# Patient Record
Sex: Male | Born: 1990 | Race: White | Hispanic: No | Marital: Single | Smoking: Current every day smoker
Health system: Southern US, Community
[De-identification: ages and names within clinical notes are randomized; demographics above are authoritative.]

---

## 2016-08-29 ENCOUNTER — Other Ambulatory Visit: Payer: Self-pay | Admitting: Nurse Practitioner

## 2016-08-29 DIAGNOSIS — N5089 Other specified disorders of the male genital organs: Secondary | ICD-10-CM

## 2016-09-01 ENCOUNTER — Other Ambulatory Visit: Payer: Self-pay

## 2016-09-20 ENCOUNTER — Ambulatory Visit
Admission: RE | Admit: 2016-09-20 | Discharge: 2016-09-20 | Disposition: A | Discharge status: Home / Self Care | Payer: BLUE CROSS/BLUE SHIELD | Source: Ambulatory Visit | Attending: Nurse Practitioner | Admitting: Nurse Practitioner

## 2016-09-20 DIAGNOSIS — N5089 Other specified disorders of the male genital organs: Secondary | ICD-10-CM

## 2017-09-17 IMAGING — US US SCROTUM
1 series · 13 of 25 positions shown · non-contrast
Comparison: None.

CLINICAL DATA: Initial evaluation for right testicular lump for 6
months.

EXAM:
SCROTAL ULTRASOUND
DOPPLER ULTRASOUND OF THE TESTICLES
TECHNIQUE: Complete ultrasound examination of the testicles, epididymis, and
other scrotal structures was performed. Color and spectral Doppler
ultrasound were also utilized to evaluate blood flow to the
testicles.

[Series 1: us scrotum · 13 of 53 slices shown]
[im 1/53]
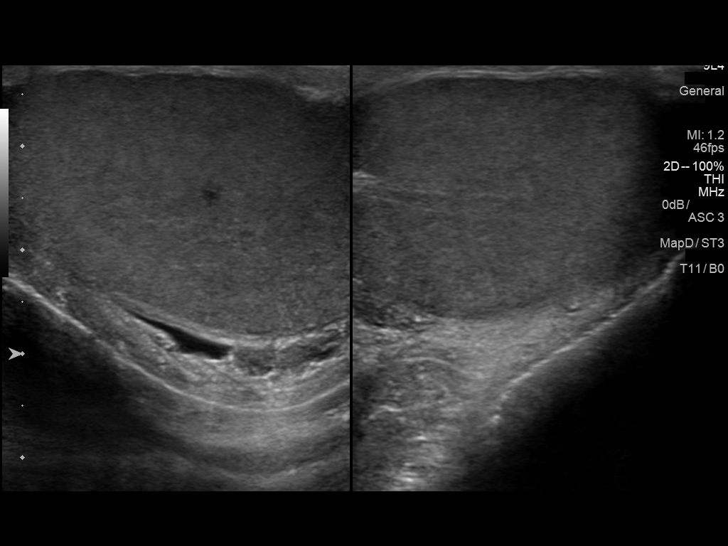
[im 5/53]
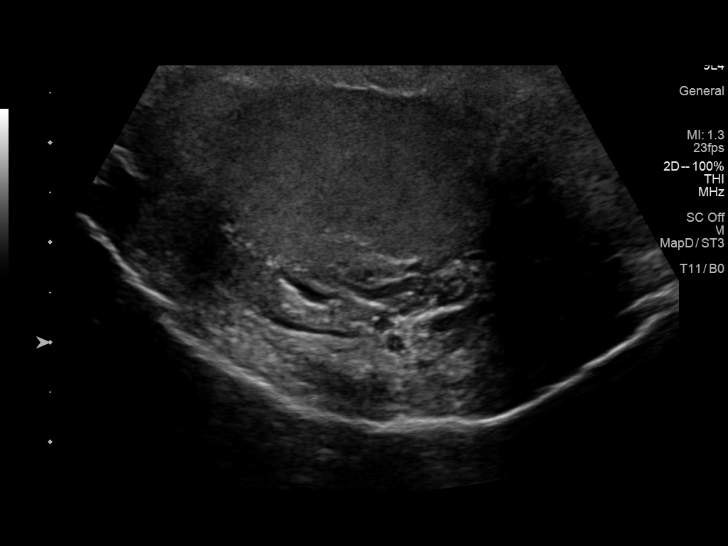
[im 9/53]
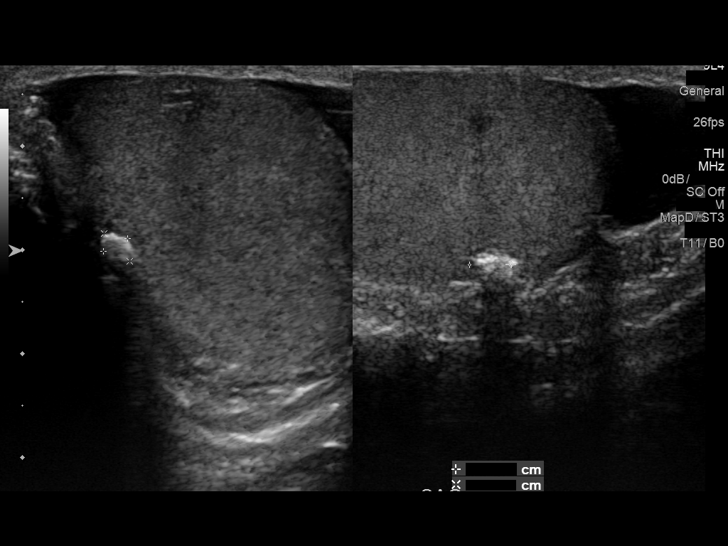
[im 14/53]
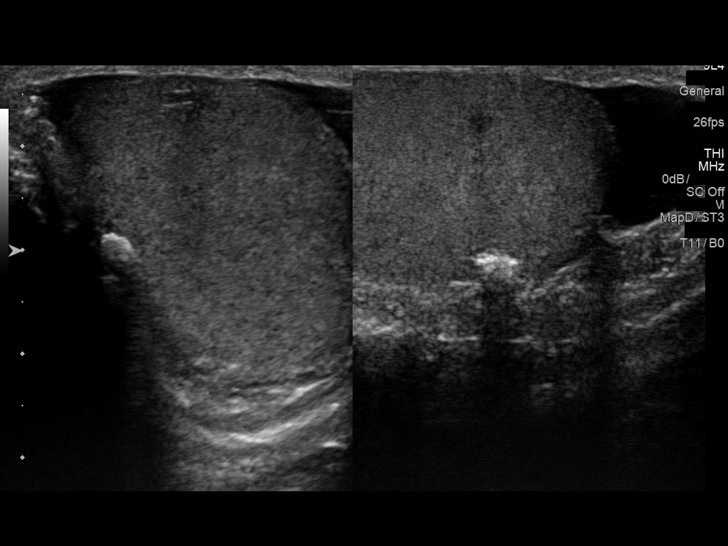
[im 18/53]
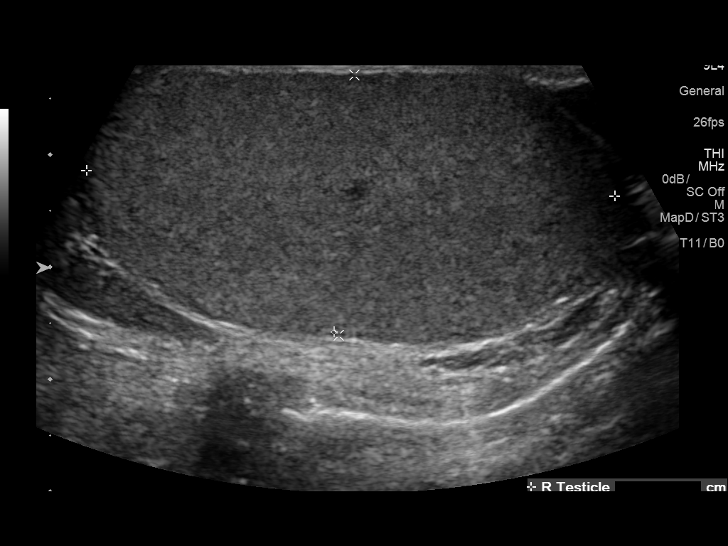
[im 22/53]
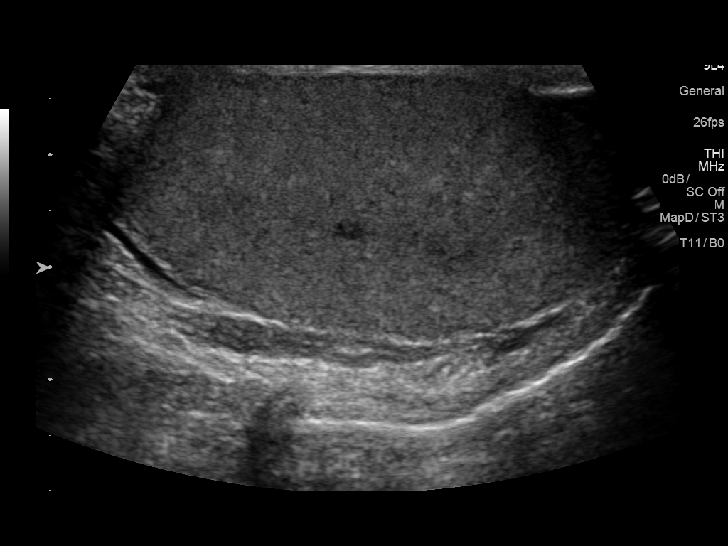
[im 27/53]
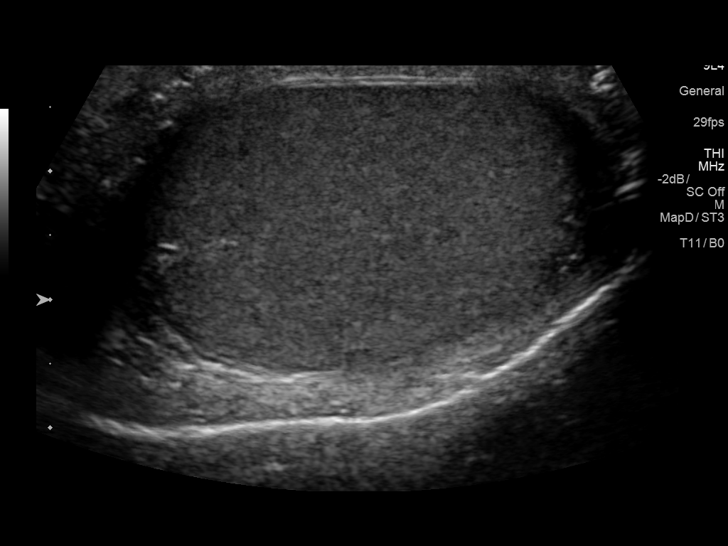
[im 31/53]
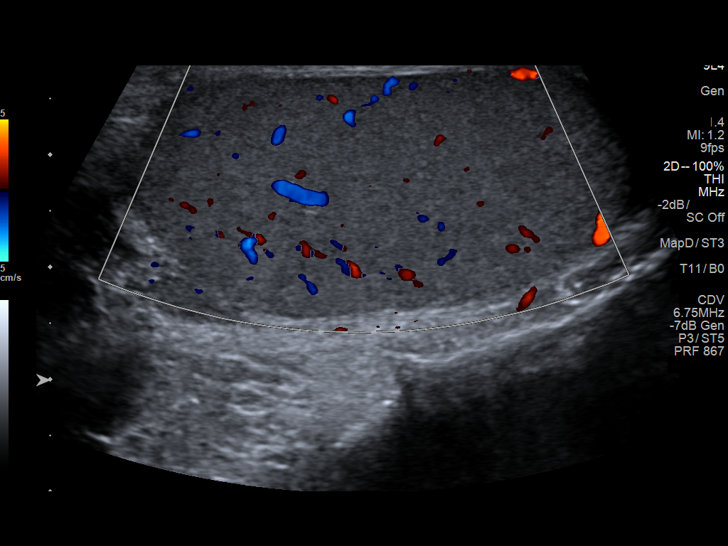
[im 35/53]
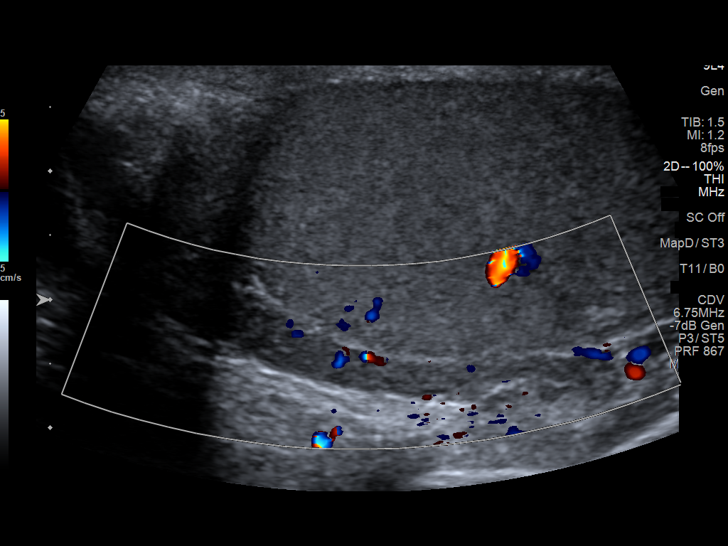
[im 40/53]
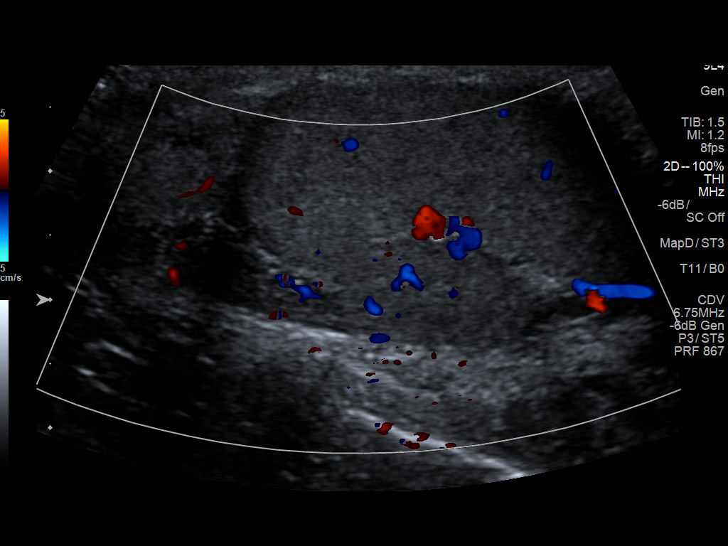
[im 44/53]
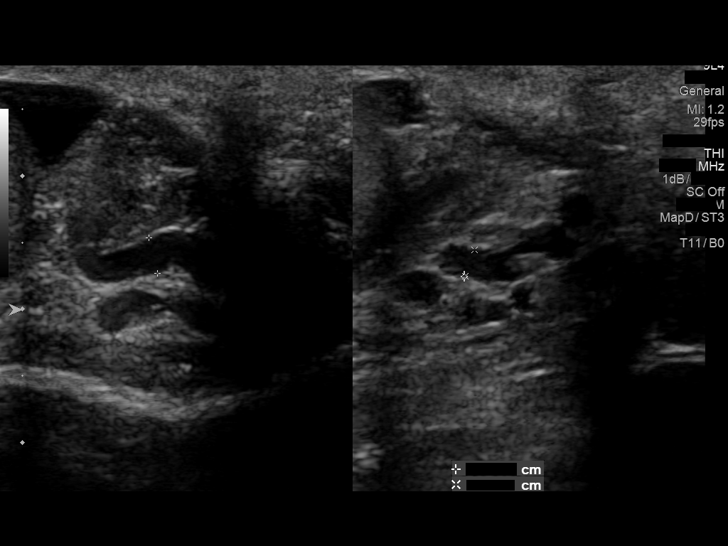
[im 48/53]
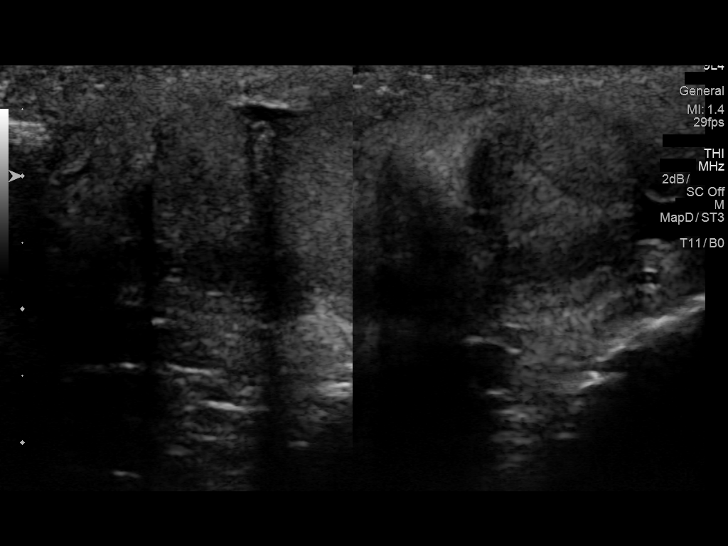
[im 53/53]
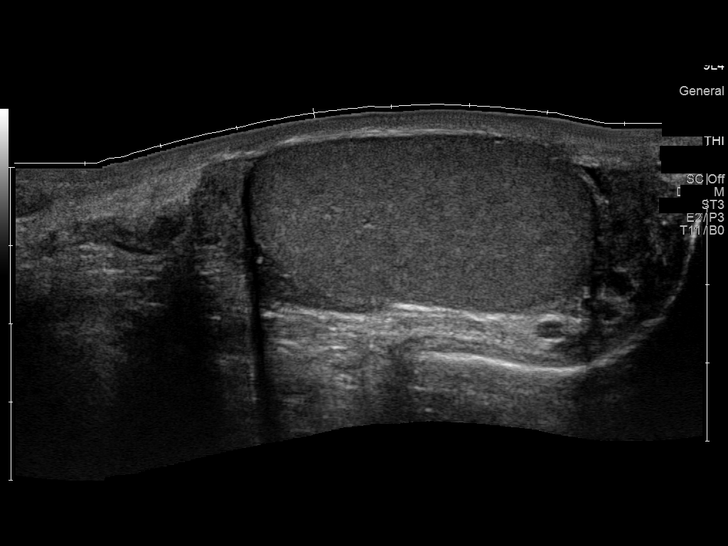

[13 of 25 positions shown; findings below may reference images not displayed]

FINDINGS: Right testicle

Measurements: 4.7 x 2.3 x 3.0 cm. No mass or microlithiasis
visualized. 3 x 4 x 4 mm echogenic focus at the inferior right
testis felt to be most consistent with a calcification. This is of
doubtful significance.

Left testicle

Measurements: 4.4 x 2.1 x 3.2 cm. No mass or microlithiasis
visualized.

Right epididymis:  Normal in size and appearance.

Left epididymis:  Normal in size and appearance.

Hydrocele:  None visualized.

Varicocele:  None visualized.

Pulsed Doppler interrogation of both testes demonstrates normal low
resistance arterial and venous waveforms bilaterally.

Per ultrasound tech, palpable area concern appears to reflect the
right spermatic cord.
IMPRESSION: 1. No cystic or solid mass identified about the testicles or
scrotum. Per the ultrasound tech, the palpable area concern appears
to represent the normal right spermatic cord.
2. 3 x 4 x 4 mm calcification at the inferior right testicle.
Finding is of uncertain etiology, but may reflect sequelae of prior
trauma or inflammation. This is felt to be of doubtful significance.
3. No other acute abnormality identified.

## 2020-01-30 ENCOUNTER — Other Ambulatory Visit: Payer: BLUE CROSS/BLUE SHIELD

## 2021-11-09 ENCOUNTER — Encounter (HOSPITAL_COMMUNITY): Payer: Self-pay | Admitting: Emergency Medicine

## 2021-11-09 ENCOUNTER — Emergency Department (HOSPITAL_COMMUNITY)
Admission: EM | Admit: 2021-11-09 | Discharge: 2021-11-09 | Disposition: A | Discharge status: Home / Self Care | Payer: BC Managed Care – PPO | Attending: Emergency Medicine | Admitting: Emergency Medicine

## 2021-11-09 ENCOUNTER — Other Ambulatory Visit: Payer: Self-pay

## 2021-11-09 DIAGNOSIS — S0093XA Contusion of unspecified part of head, initial encounter: Secondary | ICD-10-CM | POA: Insufficient documentation

## 2021-11-09 DIAGNOSIS — Y92410 Unspecified street and highway as the place of occurrence of the external cause: Secondary | ICD-10-CM | POA: Insufficient documentation

## 2021-11-09 DIAGNOSIS — R519 Headache, unspecified: Secondary | ICD-10-CM | POA: Diagnosis present

## 2021-11-09 NOTE — Discharge Instructions (Signed)
Tylenol for headache
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
## 2021-11-09 NOTE — ED Notes (Signed)
Patient states that he had a headache earlier today and had a period of dizziness.

## 2022-01-24 ENCOUNTER — Ambulatory Visit (HOSPITAL_COMMUNITY)
Admission: EM | Admit: 2022-01-24 | Discharge: 2022-01-24 | Disposition: A | Discharge status: Home / Self Care | Payer: BC Managed Care – PPO

## 2022-01-24 ENCOUNTER — Encounter (HOSPITAL_COMMUNITY): Payer: Self-pay | Admitting: Emergency Medicine

## 2022-01-24 DIAGNOSIS — K529 Noninfective gastroenteritis and colitis, unspecified: Secondary | ICD-10-CM | POA: Diagnosis not present

## 2022-01-24 DIAGNOSIS — R197 Diarrhea, unspecified: Secondary | ICD-10-CM

## 2022-01-24 DIAGNOSIS — R110 Nausea: Secondary | ICD-10-CM

## 2022-01-24 MED ORDER — DICYCLOMINE HCL 20 MG PO TABS: 20.0000 mg | ORAL_TABLET | Freq: Two times a day (BID) | ORAL | 0 refills | Status: AC

## 2022-01-24 MED ORDER — ONDANSETRON HCL 4 MG PO TABS: 4.0000 mg | ORAL_TABLET | Freq: Four times a day (QID) | ORAL | 0 refills | Status: AC

## 2022-01-24 NOTE — ED Provider Notes (Signed)
[facility]    CSN: [id number] Arrival date & time: 01/24/22  1030      History   Chief Complaint Chief Complaint  Patient presents with   Diarrhea    May have gotten food poisoning Sat afternoon. I threw up around 4pm Sat afternoon and have had diarrhea since then. I've also had stomach aches and intestinal pain. - Entered by patient    HPI Karl Campbell is a 32 y.o. male.   Patient presents today with abdominal cramping slight nausea and diarrhea since Saturday.  Patient states that he ate some pizza and approximately 3 hours later started having diarrhea and stomach cramping.  Patient has tried to take Pepto-Bismol with minimal relief.  He is able to keep some Gatorade down but just was concerned that he is not able to stay as hydrated.  Had emesis only once since Saturday none today.  Denies any chest pain no shortness of breath no fever.    History reviewed. No pertinent past medical history.  There are no problems to display for this patient.   History reviewed. No pertinent surgical history.     Home Medications    Prior to Admission medications   Medication Sig Start Date End Date Taking? Authorizing Provider  buPROPion (WELLBUTRIN SR) 150 MG 12 hr tablet Take 150 mg by mouth 2 (two) times daily. 01/14/22  Yes [provider]  dicyclomine (BENTYL) 20 MG tablet Take 1 tablet (20 mg total) by mouth 2 (two) times daily. 01/24/22  Yes Marney Setting, NP  ondansetron (ZOFRAN) 4 MG tablet Take 1 tablet (4 mg total) by mouth every 6 (six) hours. 01/24/22  Yes Marney Setting, NP    Family History History reviewed. No pertinent family history.  Social History Social History   Tobacco Use   Smoking status: Every Day    Types: Cigarettes   Smokeless tobacco: Never  Substance Use Topics   Alcohol use: Yes   Drug use: Yes    Types: Cocaine     Allergies   Shellfish allergy and Amoxicillin   Review of Systems Review of Systems   Constitutional: Negative.  Negative for chills and fever.  Eyes: Negative.   Respiratory: Negative.    Cardiovascular: Negative.   Gastrointestinal:  Positive for abdominal pain, diarrhea, nausea and vomiting. Negative for abdominal distention, anal bleeding and blood in stool.  Genitourinary: Negative.   Skin: Negative.   Neurological: Negative.      Physical Exam Triage Vital Signs ED Triage Vitals  Enc Vitals Group     BP 01/24/22 1150 (!) 158/92     Pulse Rate 01/24/22 1150 83     Resp 01/24/22 1150 16     Temp 01/24/22 1150 98.3 F (36.8 C)     Temp Source 01/24/22 1150 Oral     SpO2 01/24/22 1150 98 %     Weight --      Height --      Head Circumference --      Peak Flow --      Pain Score 01/24/22 1151 5     Pain Loc --      Pain Edu? --      Excl. in [place]? --    No data found.  Updated Vital Signs BP (!) 158/92 (BP Location: Left Arm)   Pulse 83   Temp 98.3 F (36.8 C) (Oral)   Resp 16   SpO2 98%   Visual Acuity Right Eye Distance:  Left Eye Distance:   Bilateral Distance:    Right Eye Near:   Left Eye Near:    Bilateral Near:     Physical Exam Constitutional:      Appearance: Normal appearance.  Cardiovascular:     Rate and Rhythm: Normal rate.     Pulses: Normal pulses.     Heart sounds: Normal heart sounds.  Pulmonary:     Effort: Pulmonary effort is normal.     Breath sounds: Normal breath sounds.  Abdominal:     General: Abdomen is flat. There is no distension.     Tenderness: There is no abdominal tenderness. There is no right CVA tenderness or left CVA tenderness.  Skin:    General: Skin is warm.  Neurological:     General: No focal deficit present.     Mental Status: He is alert.      UC Treatments / Results  Labs (all labs ordered are listed, but only abnormal results are displayed) Labs Reviewed - No data to display  EKG   Radiology No results found.  Procedures Procedures (including critical care  time)  Medications Ordered in UC Medications - No data to display  Initial Impression / Assessment and Plan / UC Course  I have reviewed the triage vital signs and the nursing notes.  Pertinent labs & imaging results that were available during my care of the patient were reviewed by me and considered in my medical decision making (see chart for details).     Patient should take medication as needed stay hydrated push plenty of fluids avoid any spicy foods or greasy foods which can cause irritation to the abdomen If symptoms become worse patient should be seen in the emergency room for IV fluids  Final Clinical Impressions(s) / UC Diagnoses   Final diagnoses:  Diarrhea, unspecified type  Nausea  Gastroenteritis   Discharge Instructions   None    ED Prescriptions     Medication Sig Dispense Auth. Provider   dicyclomine (BENTYL) 20 MG tablet Take 1 tablet (20 mg total) by mouth 2 (two) times daily. 20 tablet Morley Kos L, NP   ondansetron (ZOFRAN) 4 MG tablet Take 1 tablet (4 mg total) by mouth every 6 (six) hours. 12 tablet Marney Setting, NP      PDMP not reviewed this encounter.   Marney Setting, NP 01/24/22 [phone]

## 2022-01-24 NOTE — ED Triage Notes (Signed)
After eating pizza Saturday night vomited all of it back up. Has had diarrhea ever since. Reports his girlfriend has also had some stomach issues. Currently having some abdominal cramping and soreness.

## 2022-01-24 NOTE — ED Notes (Signed)
Called for in lobby, no answer

## 2022-02-17 ENCOUNTER — Telehealth: Payer: Self-pay

## 2022-02-17 ENCOUNTER — Ambulatory Visit
Admission: EM | Admit: 2022-02-17 | Discharge: 2022-02-17 | Disposition: A | Discharge status: Home / Self Care | Payer: BC Managed Care – PPO | Attending: Urgent Care | Admitting: Urgent Care

## 2022-02-17 ENCOUNTER — Ambulatory Visit (HOSPITAL_COMMUNITY)
Admission: EM | Admit: 2022-02-17 | Discharge: 2022-02-17 | Disposition: A | Discharge status: Home / Self Care | Payer: BC Managed Care – PPO | Attending: Family Medicine | Admitting: Family Medicine

## 2022-02-17 DIAGNOSIS — S76911A Strain of unspecified muscles, fascia and tendons at thigh level, right thigh, initial encounter: Secondary | ICD-10-CM | POA: Diagnosis not present

## 2022-02-17 DIAGNOSIS — Z0189 Encounter for other specified special examinations: Secondary | ICD-10-CM

## 2022-02-17 DIAGNOSIS — M791 Myalgia, unspecified site: Secondary | ICD-10-CM | POA: Diagnosis not present

## 2022-02-17 MED ORDER — ACETAMINOPHEN 325 MG PO TABS: 650.0000 mg | ORAL_TABLET | Freq: Four times a day (QID) | ORAL | 0 refills | Status: AC | PRN

## 2022-02-17 MED ORDER — TIZANIDINE HCL 4 MG PO TABS: 4.0000 mg | ORAL_TABLET | Freq: Every day | ORAL | 0 refills | Status: AC

## 2022-02-17 MED ORDER — TIZANIDINE HCL 4 MG PO TABS: 4.0000 mg | ORAL_TABLET | Freq: Every day | ORAL | 0 refills | Status: DC

## 2022-02-17 MED ORDER — ACETAMINOPHEN 325 MG PO TABS: 650.0000 mg | ORAL_TABLET | Freq: Four times a day (QID) | ORAL | 0 refills | Status: DC | PRN
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
## 2022-02-17 NOTE — ED Notes (Signed)
Patient presenting for blood draw of stat labs ordered while Patient was seen at other [facility] health urgent care today 02/17/22

## 2022-02-17 NOTE — ED Provider Notes (Signed)
[facility] - URGENT CARE CENTER  Note:  This document was prepared using Systems analyst and may include unintentional dictation errors.  MRN: [id number] DOB: December 15, 1990  Subjective:   Karl Campbell is a 32 y.o. male presenting for 4-day history of acute onset persistent right thigh pain, soreness, swelling.  Patient reports that symptoms started after he did a spin class wherein he was cycling extensively.  He did his best to maintain the workout with his instructor.  He is used to working out but not to this level.  He has been trying to hydrate, has been using Tylenol and ibuprofen.  No redness, swelling, loss of sensation.  No hematuria.  No current facility-administered medications for this encounter.  Current Outpatient Medications:    buPROPion (WELLBUTRIN SR) 150 MG 12 hr tablet, Take 150 mg by mouth 2 (two) times daily., Disp: , Rfl:    dicyclomine (BENTYL) 20 MG tablet, Take 1 tablet (20 mg total) by mouth 2 (two) times daily., Disp: 20 tablet, Rfl: 0   ondansetron (ZOFRAN) 4 MG tablet, Take 1 tablet (4 mg total) by mouth every 6 (six) hours., Disp: 12 tablet, Rfl: 0   Allergies  Allergen Reactions   Shellfish Allergy Anaphylaxis   Amoxicillin     History reviewed. No pertinent past medical history.   History reviewed. No pertinent surgical history.  No family history on file.  Social History   Tobacco Use   Smoking status: Every Day    Types: Cigarettes   Smokeless tobacco: Never  Vaping Use   Vaping Use: Never used  Substance Use Topics   Alcohol use: Yes    Comment: occ   Drug use: Yes    Types: Cocaine    Comment: last used 12/2021   ROS   Objective:   Vitals: BP (!) 154/86 (BP Location: Left Arm)   Pulse 75   Temp 98.5 F (36.9 C) (Oral)   Resp 18   SpO2 98%   Physical Exam Constitutional:      General: He is not in acute distress.    Appearance: Normal appearance. He is well-developed and normal weight. He is not  ill-appearing, toxic-appearing or diaphoretic.  HENT:     Head: Normocephalic and atraumatic.     Right Ear: External ear normal.     Left Ear: External ear normal.     Nose: Nose normal.     Mouth/Throat:     Pharynx: Oropharynx is clear.  Eyes:     General: No scleral icterus.       Right eye: No discharge.        Left eye: No discharge.     Extraocular Movements: Extraocular movements intact.  Cardiovascular:     Rate and Rhythm: Normal rate.  Pulmonary:     Effort: Pulmonary effort is normal.  Musculoskeletal:     Cervical back: Normal range of motion.     Right upper leg: Swelling (1+ for the right thigh) and tenderness (over areas outlined) present. No edema, deformity, lacerations or bony tenderness.     Left upper leg: No swelling, edema, deformity, lacerations, tenderness or bony tenderness.       Legs:  Neurological:     Mental Status: He is alert and oriented to person, place, and time.  Psychiatric:        Mood and Affect: Mood normal.        Behavior: Behavior normal.        Thought Content: Thought content  normal.        Judgment: Judgment normal.     Assessment and Plan :   PDMP not reviewed this encounter.  1. Muscle strain of right thigh, initial encounter   2. Myalgia     Will manage conservatively for right thigh strain with Tylenol and muscle relaxant, rest and modification of physical activity.  Emphasized need for hydration with 80 to 100 ounces water daily.  Will pursue blood work to rule out rhabdomyolysis.  Hold off on NSAIDs for now.  Anticipatory guidance provided.  Counseled patient on potential for adverse effects with medications prescribed/recommended today, ER and return-to-clinic precautions discussed, patient verbalized understanding.    Jaynee Eagles, [place] 02/17/22 1914

## 2022-02-17 NOTE — Discharge Instructions (Addendum)
Please just use Tylenol at a dose of 500mg -650mg  once every 6 hours as needed for your aches.  Okay to use tizanidine with this.  Do not use any nonsteroidal anti-inflammatories (NSAIDs) like ibuprofen, Motrin, naproxen, Aleve, etc. which are all available over-the-counter.

## 2022-02-17 NOTE — ED Notes (Signed)
Lab pick up done for today-Mani, PA notified-pt to go to [place] for lab draw

## 2022-02-17 NOTE — Telephone Encounter (Signed)
Patient called stating he wants medication sent to [facility] on [address]. Medications sent.

## 2022-02-18 ENCOUNTER — Emergency Department (HOSPITAL_COMMUNITY)
Admission: EM | Admit: 2022-02-18 | Discharge: 2022-02-18 | Disposition: A | Discharge status: Home / Self Care | Payer: BC Managed Care – PPO | Attending: Emergency Medicine | Admitting: Emergency Medicine

## 2022-02-18 ENCOUNTER — Ambulatory Visit (HOSPITAL_COMMUNITY)
Admission: EM | Admit: 2022-02-18 | Discharge: 2022-02-18 | Disposition: A | Discharge status: Home / Self Care | Payer: BC Managed Care – PPO | Source: Home / Self Care

## 2022-02-18 ENCOUNTER — Other Ambulatory Visit: Payer: Self-pay

## 2022-02-18 ENCOUNTER — Encounter (HOSPITAL_COMMUNITY): Payer: Self-pay | Admitting: Emergency Medicine

## 2022-02-18 DIAGNOSIS — R7989 Other specified abnormal findings of blood chemistry: Secondary | ICD-10-CM | POA: Insufficient documentation

## 2022-02-18 DIAGNOSIS — M79661 Pain in right lower leg: Secondary | ICD-10-CM | POA: Insufficient documentation

## 2022-02-18 DIAGNOSIS — M7918 Myalgia, other site: Secondary | ICD-10-CM | POA: Insufficient documentation

## 2022-02-18 DIAGNOSIS — M79604 Pain in right leg: Secondary | ICD-10-CM

## 2022-02-18 LAB — COMPREHENSIVE METABOLIC PANEL
ALT: 69 U/L — ABNORMAL HIGH (ref 0–44)
AST: 173 U/L — ABNORMAL HIGH (ref 15–41)
Albumin: 3.8 g/dL (ref 3.5–5.0)
Alkaline Phosphatase: 56 U/L (ref 38–126)
Anion gap: 10 (ref 5–15)
BUN: 14 mg/dL (ref 6–20)
CO2: 25 mmol/L (ref 22–32)
Calcium: 9.3 mg/dL (ref 8.9–10.3)
Chloride: 101 mmol/L (ref 98–111)
Creatinine, Ser: 1.01 mg/dL (ref 0.61–1.24)
GFR, Estimated: >60 mL/min (ref >60)
Glucose, Bld: 78 mg/dL (ref 70–99)
Potassium: 4.5 mmol/L (ref 3.5–5.1)
Sodium: 136 mmol/L (ref 135–145)
Total Bilirubin: 0.1 mg/dL — ABNORMAL LOW (ref 0.3–1.2)
Total Protein: 7.2 g/dL (ref 6.5–8.1)

## 2022-02-18 LAB — URINALYSIS, ROUTINE W REFLEX MICROSCOPIC
Bilirubin Urine: NEGATIVE
Glucose, UA: NEGATIVE mg/dL
Hgb urine dipstick: NEGATIVE
Ketones, ur: NEGATIVE mg/dL
Leukocytes,Ua: NEGATIVE
Nitrite: NEGATIVE
Protein, ur: NEGATIVE mg/dL
Specific Gravity, Urine: 1.019 (ref 1.005–1.030)
pH: 6 (ref 5.0–8.0)

## 2022-02-18 LAB — CBC WITH DIFFERENTIAL/PLATELET
Abs Immature Granulocytes: 0.05 10*3/uL (ref 0.00–0.07)
Basophils Absolute: 0 10*3/uL (ref 0.0–0.1)
Basophils Relative: 0 %
Eosinophils Absolute: 0.1 10*3/uL (ref 0.0–0.5)
Eosinophils Relative: 1 %
HCT: 43.5 % (ref 39.0–52.0)
Hemoglobin: 14.8 g/dL (ref 13.0–17.0)
Immature Granulocytes: 1 %
Lymphocytes Relative: 23 %
Lymphs Abs: 2.3 10*3/uL (ref 0.7–4.0)
MCH: 30.9 pg (ref 26.0–34.0)
MCHC: 34 g/dL (ref 30.0–36.0)
MCV: 90.8 fL (ref 80.0–100.0)
Monocytes Absolute: 1 10*3/uL (ref 0.1–1.0)
Monocytes Relative: 10 %
Neutro Abs: 6.5 10*3/uL (ref 1.7–7.7)
Neutrophils Relative %: 65 %
Platelets: 301 10*3/uL (ref 150–400)
RBC: 4.79 MIL/uL (ref 4.22–5.81)
RDW: 12.9 % (ref 11.5–15.5)
WBC: 10 10*3/uL (ref 4.0–10.5)
nRBC: 0 % (ref 0.0–0.2)

## 2022-02-18 LAB — BASIC METABOLIC PANEL
Anion gap: 10 (ref 5–15)
BUN: 9 mg/dL (ref 6–20)
CO2: 26 mmol/L (ref 22–32)
Calcium: 9.3 mg/dL (ref 8.9–10.3)
Chloride: 101 mmol/L (ref 98–111)
Creatinine, Ser: 1.03 mg/dL (ref 0.61–1.24)
GFR, Estimated: >60 mL/min (ref >60)
Glucose, Bld: 96 mg/dL (ref 70–99)
Potassium: 4.2 mmol/L (ref 3.5–5.1)
Sodium: 137 mmol/L (ref 135–145)

## 2022-02-18 LAB — CK: Total CK: 7347 U/L — ABNORMAL HIGH (ref 49–397)

## 2022-02-18 NOTE — ED Triage Notes (Signed)
Pt presents to the office for repeat lab work. Informed the patient, once lab work have been completed/reviewed we will give him a call. Pt verbalized understanding.

## 2022-02-18 NOTE — ED Provider Triage Note (Signed)
Emergency Medicine Provider Triage Evaluation Note  Karl Campbell , a 32 y.o. male  was evaluated in triage.  Pt complains of elevated CK.  Patient was seen at urgent care yesterday for concern about thigh pain after increased exertion as a high intensity fitness class.  Patient reports he has felt that his legs been excessively painful and some cramping but denies any significant weakness, fevers, urinary changes.  Patient had CK level checked at urgent care yesterday and lab was drawn today which showed elevation and CK at 7347.  Patient was advised to come to the emergency department for further evaluation given the CK elevation..  Review of Systems  Positive: As above Negative: As above  Physical Exam  BP (!) 159/98 (BP Location: Left Arm)   Pulse 95   Temp 98.6 F (37 C) (Oral)   Resp 17   SpO2 100%  Gen:   Awake, no distress Resp:  Normal effort MSK:   Moves extremities without difficulty  Other:    Medical Decision Making  Medically screening exam initiated at 3:43 PM.  Appropriate orders placed.  Salley Hews was informed that the remainder of the evaluation will be completed by another provider, this initial triage assessment does not replace that evaluation, and the importance of remaining in the ED until their evaluation is complete.     Luvenia Heller, PA-C 02/18/22 1545

## 2022-02-18 NOTE — ED Provider Notes (Signed)
[facility] Provider Note   CSN: [id number] Arrival date & time: 02/18/22  1511     History Chief Complaint  Patient presents with   Abnormal Lab    HPI Karl Campbell is a 32 y.o. male presenting for chief complaint of abnormal lab.  He is an otherwise healthy 32 year old male states that he went to a cycling class III days ago went to urgent care 2 days ago with right leg pain had a CK level drawn which came back today elevated.  He has stated all of his symptoms have resolved.  Denies fevers or chills, nausea vomiting, syncope shortness of breath.  Denies any discoloration in urine.  Has a large bowel water bedside and states has been hydrating aggressively.  Currently symptoms have resolved.  No medications prior to arrival..   Patient's recorded medical, surgical, social, medication list and allergies were reviewed in the Snapshot window as part of the initial history.   Review of Systems   Review of Systems  Constitutional:  Negative for chills and fever.  HENT:  Negative for ear pain and sore throat.   Eyes:  Negative for pain and visual disturbance.  Respiratory:  Negative for cough and shortness of breath.   Cardiovascular:  Negative for chest pain and palpitations.  Gastrointestinal:  Negative for abdominal pain and vomiting.  Genitourinary:  Negative for dysuria and hematuria.  Musculoskeletal:  Negative for arthralgias and back pain.  Skin:  Negative for color change and rash.  Neurological:  Negative for seizures and syncope.  All other systems reviewed and are negative.   Physical Exam Updated Vital Signs BP (!) 159/98 (BP Location: Left Arm)   Pulse 95   Temp 98.6 F (37 C) (Oral)   Resp 17   SpO2 100%  Physical Exam Vitals and nursing note reviewed.  Constitutional:      General: He is not in acute distress.    Appearance: He is well-developed.  HENT:     Head: Normocephalic and atraumatic.  Eyes:      Conjunctiva/sclera: Conjunctivae normal.  Cardiovascular:     Rate and Rhythm: Normal rate and regular rhythm.     Heart sounds: No murmur heard. Pulmonary:     Effort: Pulmonary effort is normal. No respiratory distress.     Breath sounds: Normal breath sounds.  Abdominal:     Palpations: Abdomen is soft.     Tenderness: There is no abdominal tenderness.  Musculoskeletal:        General: No swelling.     Cervical back: Neck supple.  Skin:    General: Skin is warm and dry.     Capillary Refill: Capillary refill takes less than 2 seconds.  Neurological:     Mental Status: He is alert.  Psychiatric:        Mood and Affect: Mood normal.      ED Course/ Medical Decision Making/ A&P    Procedures Procedures   Medications Ordered in ED Medications - No data to display  Medical Decision Making:    Karl Campbell is a 32 y.o. male who presented to the ED today with abnormal lab detailed above.     Patient placed on continuous vitals and telemetry monitoring while in ED which was reviewed periodically.   Complete initial physical exam performed, notably the patient  was hemodynamically stable in no acute distress.      Reviewed and confirmed nursing documentation for past medical history, family history,  social history.    Initial Assessment:   This is most consistent with an acute potentially life/limb-threatening illness Patient was sent for reevaluation for potential rhabdomyolysis given elevated CK andPotential source in the form of a strenuous exercise episode. Screening labs ordered including urinalysis to evaluate for myoglobin, CBC to evaluate for hemoglobin change and CMP to evaluate for metabolic disruption including AKI.  All of the studies resulted with no acute pathology.  Given overall reassuring evaluation, well appearance I had a shared medical decision making conversation with the patient.  Offered IV fluids though this does not seem indicated given well  appearance otherwise.  Patient states he feels comfortable with outpatient care management at this time would like to be discharged. I discussed the incidental finding of his LFT abnormalities.  He has been cutting back on how much he is drinking and is planning to continue doing so.  Recommend he follow-up with PCP for repeat labs in the outpatient setting as well as further evaluation.   Disposition:  I have considered need for hospitalization, however, considering all of the above, I believe this patient is stable for discharge at this time.  Patient/family educated about specific return precautions for given chief complaint and symptoms.  Patient/family educated about follow-up with PCP .     Patient/family expressed understanding of return precautions and need for follow-up. Patient spoken to regarding all imaging and laboratory results and appropriate follow up for these results. All education provided in verbal form with additional information in written form. Time was allowed for answering of patient questions. Patient discharged.    Emergency Department Medication Summary:   Medications - No data to display      Clinical Impression:  1. Pain of right lower extremity   2. Elevated LFTs      Discharge   Final Clinical Impression(s) / ED Diagnoses Final diagnoses:  Pain of right lower extremity  Elevated LFTs    Rx / DC Orders ED Discharge Orders     None         Tretha Sciara, MD 02/18/22 1733

## 2022-02-18 NOTE — ED Triage Notes (Signed)
Patient went to [facility] for muscle pain in thigh and swelling after a spin class and had blood work done.  Patient CK came back today at 7300 and was sent here for IV fluids.
